# Patient Record
Sex: Female | Born: 2005 | Race: Black or African American | Hispanic: No | Marital: Single | Smoking: Never smoker
Health system: Southern US, Community
[De-identification: ages and names within clinical notes are randomized; demographics above are authoritative.]

## PROBLEM LIST (undated history)

## (undated) DIAGNOSIS — K5900 Constipation, unspecified: Secondary | ICD-10-CM

## (undated) DIAGNOSIS — J45909 Unspecified asthma, uncomplicated: Secondary | ICD-10-CM

## (undated) DIAGNOSIS — S060XAA Concussion with loss of consciousness status unknown, initial encounter: Secondary | ICD-10-CM

## (undated) DIAGNOSIS — S060X9A Concussion with loss of consciousness of unspecified duration, initial encounter: Secondary | ICD-10-CM

## (undated) DIAGNOSIS — G4700 Insomnia, unspecified: Secondary | ICD-10-CM

## (undated) HISTORY — DX: Insomnia, unspecified: G47.00

---
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
## 2023-01-03 DIAGNOSIS — F411 Generalized anxiety disorder: Secondary | ICD-10-CM | POA: Diagnosis not present

## 2023-01-04 ENCOUNTER — Other Ambulatory Visit: Payer: Medicaid Other

## 2023-01-06 ENCOUNTER — Other Ambulatory Visit: Payer: Medicaid Other

## 2023-01-06 DIAGNOSIS — Z111 Encounter for screening for respiratory tuberculosis: Secondary | ICD-10-CM | POA: Diagnosis not present

## 2023-01-09 ENCOUNTER — Ambulatory Visit: Payer: Medicaid Other | Admitting: Pediatrics

## 2023-01-09 VITALS — Wt 176.5 lb

## 2023-01-09 DIAGNOSIS — A159 Respiratory tuberculosis unspecified: Secondary | ICD-10-CM

## 2023-01-09 LAB — READ PPD: TB Skin Test: NEGATIVE

## 2023-01-09 NOTE — Patient Instructions (Addendum)
PPD performed on 01/06/2023 was read on 01/09/2023 by Clois Comber (CMA) documented as negative 0mm no induration.

## 2023-01-11 ENCOUNTER — Other Ambulatory Visit: Payer: Medicaid Other

## 2023-01-11 NOTE — Progress Notes (Signed)
TB screen negative.

## 2023-01-17 DIAGNOSIS — F411 Generalized anxiety disorder: Secondary | ICD-10-CM | POA: Diagnosis not present

## 2023-03-07 ENCOUNTER — Other Ambulatory Visit: Payer: Self-pay | Admitting: Pediatrics

## 2023-03-07 DIAGNOSIS — J4520 Mild intermittent asthma, uncomplicated: Secondary | ICD-10-CM

## 2023-04-17 ENCOUNTER — Other Ambulatory Visit: Payer: Self-pay | Admitting: Pediatrics

## 2023-05-01 ENCOUNTER — Encounter: Payer: Self-pay | Admitting: Pediatrics

## 2023-05-02 ENCOUNTER — Telehealth: Payer: Self-pay | Admitting: Pediatrics

## 2023-05-02 NOTE — Telephone Encounter (Signed)
Called patient and left message on both numbers on file to give [place] a call back regarding check up appt.

## 2023-05-02 NOTE — Telephone Encounter (Signed)
Called patient for the 2nd time today and left message to return call regarding appt for checkup.

## 2023-05-09 ENCOUNTER — Ambulatory Visit: Payer: Self-pay | Admitting: Pediatrics

## 2023-06-15 DIAGNOSIS — H5213 Myopia, bilateral: Secondary | ICD-10-CM | POA: Diagnosis not present

## 2023-10-05 ENCOUNTER — Other Ambulatory Visit: Payer: Self-pay | Admitting: Pediatrics

## 2023-11-01 ENCOUNTER — Other Ambulatory Visit: Payer: Self-pay | Admitting: Pediatrics

## 2023-12-06 DIAGNOSIS — F411 Generalized anxiety disorder: Secondary | ICD-10-CM | POA: Diagnosis not present

## 2023-12-25 DIAGNOSIS — F411 Generalized anxiety disorder: Secondary | ICD-10-CM | POA: Diagnosis not present

## 2024-01-08 ENCOUNTER — Other Ambulatory Visit (HOSPITAL_COMMUNITY)
Admission: RE | Admit: 2024-01-08 | Discharge: 2024-01-08 | Disposition: A | Discharge status: Home / Self Care | Source: Ambulatory Visit | Attending: Pediatrics | Admitting: Pediatrics

## 2024-01-08 ENCOUNTER — Encounter: Payer: Self-pay | Admitting: Pediatrics

## 2024-01-08 ENCOUNTER — Ambulatory Visit (INDEPENDENT_AMBULATORY_CARE_PROVIDER_SITE_OTHER): Admitting: Pediatrics

## 2024-01-08 VITALS — BP 108/68 | HR 71 | Ht 65.95 in | Wt 175.6 lb

## 2024-01-08 DIAGNOSIS — Z114 Encounter for screening for human immunodeficiency virus [HIV]: Secondary | ICD-10-CM | POA: Diagnosis not present

## 2024-01-08 DIAGNOSIS — N946 Dysmenorrhea, unspecified: Secondary | ICD-10-CM

## 2024-01-08 DIAGNOSIS — Z113 Encounter for screening for infections with a predominantly sexual mode of transmission: Secondary | ICD-10-CM

## 2024-01-08 DIAGNOSIS — Z1339 Encounter for screening examination for other mental health and behavioral disorders: Secondary | ICD-10-CM

## 2024-01-08 DIAGNOSIS — J302 Other seasonal allergic rhinitis: Secondary | ICD-10-CM | POA: Diagnosis not present

## 2024-01-08 DIAGNOSIS — Z0001 Encounter for general adult medical examination with abnormal findings: Secondary | ICD-10-CM

## 2024-01-08 DIAGNOSIS — Z1331 Encounter for screening for depression: Secondary | ICD-10-CM

## 2024-01-08 DIAGNOSIS — J4540 Moderate persistent asthma, uncomplicated: Secondary | ICD-10-CM | POA: Diagnosis not present

## 2024-01-08 DIAGNOSIS — Z68.41 Body mass index (BMI) pediatric, 85th percentile to less than 95th percentile for age: Secondary | ICD-10-CM | POA: Diagnosis not present

## 2024-01-08 DIAGNOSIS — J4520 Mild intermittent asthma, uncomplicated: Secondary | ICD-10-CM

## 2024-01-08 LAB — POCT RAPID HIV: Rapid HIV, POC: NEGATIVE

## 2024-01-08 MED ORDER — SYMBICORT 160-4.5 MCG/ACT IN AERO: 2.0000 | INHALATION_SPRAY | Freq: Two times a day (BID) | RESPIRATORY_TRACT | 3 refills | Status: AC

## 2024-01-08 MED ORDER — ALBUTEROL SULFATE HFA 108 (90 BASE) MCG/ACT IN AERS
2.0000 | INHALATION_SPRAY | RESPIRATORY_TRACT | 2 refills | Status: AC | PRN
Start: 2024-01-08 — End: ?

## 2024-01-08 MED ORDER — OLOPATADINE HCL 0.2 % OP SOLN: 1.0000 [drp] | Freq: Every day | OPHTHALMIC | 6 refills | Status: AC

## 2024-01-08 MED ORDER — NAPROXEN 250 MG PO TABS: 250.0000 mg | ORAL_TABLET | Freq: Two times a day (BID) | ORAL | 3 refills | Status: AC

## 2024-01-08 MED ORDER — FLUTICASONE PROPIONATE 50 MCG/ACT NA SUSP: 2.0000 | Freq: Every day | NASAL | 6 refills | Status: AC

## 2024-01-08 MED ORDER — MONTELUKAST SODIUM 10 MG PO TABS: 10.0000 mg | ORAL_TABLET | Freq: Every day | ORAL | 4 refills | Status: AC

## 2024-01-08 MED ORDER — CETIRIZINE HCL 10 MG PO TABS: 10.0000 mg | ORAL_TABLET | Freq: Every day | ORAL | 4 refills | Status: AC

## 2024-01-08 NOTE — Patient Instructions (Signed)
Goals: Choose more whole grains, lean protein, low-fat dairy, and fruits/non-starchy vegetables. Aim for 60 min of moderate physical activity daily. Limit sugar-sweetened beverages and concentrated sweets. Limit screen time to less than 2 hours daily.  [address]10 5 servings of fruits/vegetables a day 3 meals a day, no meal skipping 2 hours of screen time or less 1 hour of vigorous physical activity Almost no sugar-sweetened beverages or foods    

## 2024-01-08 NOTE — Progress Notes (Signed)
 Adolescent Well Care Visit Kathleen Webster is a 18 y.o. female who is here for well care.    PCP:  Gabriella Arthor GAILS, MD   History was provided by the patient.   Current Issues: Current concerns include Here for school summer break. Overall doing well. Needs refill on asthma & allergy meds. Flare up of allergies this summer & using Symbicort    Nutrition: Nutrition/Eating Behaviors: eats a variety of foods & has been mindful about healthy eating. Adequate calcium in diet?: no- does not like milk or yogurt Supplements/ Vitamins: no  Exercise/ Media: Play any Sports?/ Exercise: walks Screen Time:  > 2 hours-counseling provided Media Rules or Monitoring?: no  Sleep:  Sleep: no issues  Social Screening: Lives - college apartment Parental relations:  good Activities, Work, and Regulatory affairs officer?: works at [employer] out. RA at school. Concerns regarding behavior with peers?  no Stressors of note: no  Education: School Name: [employer]  School Grade: sophomore-major in business administration & music School performance: doing well; no concerns School Behavior: doing well; no concerns  Menstruation:    Menstrual History: regular cycles, every month, has painful cycles & takes naproxen   Confidential Social History: Tobacco?  yes Secondhand smoke exposure?  no Drugs/ETOH?  no  Sexually Active?  no   Pregnancy Prevention: Abstinence  Safe at home, in school & in relationships?  Yes Safe to self?  Yes   Screenings: Patient has a dental home: yes  The patient completed the Rapid Assessment for Adolescent Preventive Services screening questionnaire and the following topics were identified as risk factors and discussed: healthy eating, exercise, marijuana use, drug use, condom use, birth control, mental health issues, and screen time   PHQ-9 completed and results indicated - negative  Physical Exam:  Vitals:   01/08/24 0844  BP: 108/68  Pulse: 71  SpO2: 98%  Weight: 175 lb 9.6 oz  (79.7 kg)  Height: 5' 5.95 (1.675 m)   BP 108/68 (BP Location: Left Arm, Patient Position: Sitting, Cuff Size: Normal)   Pulse 71   Ht 5' 5.95 (1.675 m)   Wt 175 lb 9.6 oz (79.7 kg)   SpO2 98%   BMI 28.39 kg/m  Body mass index: body mass index is 28.39 kg/m. Blood pressure %iles are not available for patients who are 18 years or older.  Hearing Screening  Method: Audiometry   500Hz  1000Hz  2000Hz  4000Hz   Right ear Fail 20 20 20   Left ear Fail 20 20 20    Vision Screening   Right eye Left eye Both eyes  Without correction     With correction 20/20 20/20 20/20     General Appearance:   alert, oriented, no acute distress  HENT: Normocephalic, no obvious abnormality, conjunctiva clear  Mouth:   Normal appearing teeth, no obvious discoloration, dental caries, or dental caps  Neck:   Supple; thyroid : no enlargement, symmetric, no tenderness/mass/nodules  Chest normal  Lungs:   Clear to auscultation bilaterally, normal work of breathing  Heart:   Regular rate and rhythm, S1 and S2 normal, no murmurs;   Abdomen:   Soft, non-tender, no mass, or organomegaly  GU normal female external genitalia, pelvic not performed  Musculoskeletal:   Tone and strength strong and symmetrical, all extremities               Lymphatic:   No cervical adenopathy  Skin/Hair/Nails:   Skin warm, dry and intact, no rashes, no bruises or petechiae  Neurologic:   Strength, gait, and coordination normal  and age-appropriate     Assessment and Plan:   18 yr old F for wlel visit Mod persistent asthma with seasonal allergies Continue Symbicort  use daily during flare ups & then can use during exacerbations. Refilled all asthma & allergy meds.  BMI is not appropriate for age Counseled regarding 5-2-1-0 goals of healthy active living including:  - eating at least 5 fruits and vegetables a day - at least 1 hour of activity - no sugary beverages - eating three meals each day with age-appropriate servings -  age-appropriate screen time - age-appropriate sleep patterns    Hearing screening result:normal Vision screening result: normal  Orders Placed This Encounter  Procedures   POCT Rapid HIV     Return in 1 year (on 01/07/2025) for Well child with Dr Gabriella.SABRA Arthor LULLA Gabriella, MD

## 2024-01-09 ENCOUNTER — Other Ambulatory Visit: Payer: Self-pay | Admitting: Pediatrics

## 2024-01-09 DIAGNOSIS — J4540 Moderate persistent asthma, uncomplicated: Secondary | ICD-10-CM

## 2024-01-09 LAB — URINE CYTOLOGY ANCILLARY ONLY
Chlamydia: NEGATIVE
Comment: NEGATIVE
Comment: NORMAL
Neisseria Gonorrhea: NEGATIVE

## 2024-01-10 DIAGNOSIS — F411 Generalized anxiety disorder: Secondary | ICD-10-CM | POA: Diagnosis not present

## 2024-04-02 ENCOUNTER — Telehealth: Payer: Self-pay | Admitting: Pediatrics
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
## 2024-04-04 ENCOUNTER — Encounter: Payer: Self-pay | Admitting: Pediatrics

## 2024-04-05 DIAGNOSIS — R55 Syncope and collapse: Secondary | ICD-10-CM | POA: Diagnosis not present

## 2024-04-05 DIAGNOSIS — Y92410 Unspecified street and highway as the place of occurrence of the external cause: Secondary | ICD-10-CM | POA: Diagnosis not present

## 2024-04-05 DIAGNOSIS — M25551 Pain in right hip: Secondary | ICD-10-CM | POA: Diagnosis not present

## 2024-04-26 DIAGNOSIS — M25571 Pain in right ankle and joints of right foot: Secondary | ICD-10-CM | POA: Diagnosis not present

## 2024-04-26 DIAGNOSIS — M79661 Pain in right lower leg: Secondary | ICD-10-CM | POA: Diagnosis not present

## 2024-04-26 DIAGNOSIS — M79651 Pain in right thigh: Secondary | ICD-10-CM | POA: Diagnosis not present

## 2024-04-26 DIAGNOSIS — M25551 Pain in right hip: Secondary | ICD-10-CM | POA: Diagnosis not present

## 2024-04-29 ENCOUNTER — Encounter: Payer: Self-pay | Admitting: Pediatrics

## 2024-04-29 ENCOUNTER — Telehealth: Payer: Self-pay | Admitting: Pediatrics

## 2024-04-29 ENCOUNTER — Ambulatory Visit: Admitting: Pediatrics

## 2024-04-29 VITALS — Wt 180.6 lb

## 2024-04-29 DIAGNOSIS — M25551 Pain in right hip: Secondary | ICD-10-CM | POA: Diagnosis not present
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
## 2024-04-29 NOTE — Patient Instructions (Signed)
 Please call [facility] to schedule Orthopedics & let us  know about that appt.

## 2024-04-29 NOTE — Telephone Encounter (Signed)
 Good morning,  Please give mom a call once the FMLA forms has been completed and ready for pick up.  Thanks,

## 2024-05-01 NOTE — Telephone Encounter (Signed)
 FMLA forms placed in Dr. Durenda folder.

## 2024-05-02 NOTE — Telephone Encounter (Signed)
 Parent notified FMLA form is ready for pick up.Copy to media to scan.

## 2024-05-04 DIAGNOSIS — M25551 Pain in right hip: Secondary | ICD-10-CM | POA: Insufficient documentation

## 2024-05-08 DIAGNOSIS — M25571 Pain in right ankle and joints of right foot: Secondary | ICD-10-CM | POA: Diagnosis not present

## 2024-05-29 DIAGNOSIS — S93421A Sprain of deltoid ligament of right ankle, initial encounter: Secondary | ICD-10-CM | POA: Diagnosis not present

## 2024-05-30 DIAGNOSIS — G90521 Complex regional pain syndrome I of right lower limb: Secondary | ICD-10-CM | POA: Diagnosis not present

## 2024-05-30 DIAGNOSIS — R208 Other disturbances of skin sensation: Secondary | ICD-10-CM | POA: Diagnosis not present

## 2024-06-05 DIAGNOSIS — R051 Acute cough: Secondary | ICD-10-CM | POA: Diagnosis not present

## 2024-06-05 DIAGNOSIS — J101 Influenza due to other identified influenza virus with other respiratory manifestations: Secondary | ICD-10-CM | POA: Diagnosis not present

## 2024-06-10 DIAGNOSIS — S93401A Sprain of unspecified ligament of right ankle, initial encounter: Secondary | ICD-10-CM | POA: Diagnosis not present

## 2024-06-10 DIAGNOSIS — M25571 Pain in right ankle and joints of right foot: Secondary | ICD-10-CM | POA: Diagnosis not present

## 2024-06-10 DIAGNOSIS — M25871 Other specified joint disorders, right ankle and foot: Secondary | ICD-10-CM | POA: Diagnosis not present

## 2024-06-10 DIAGNOSIS — S96911A Strain of unspecified muscle and tendon at ankle and foot level, right foot, initial encounter: Secondary | ICD-10-CM | POA: Diagnosis not present

## 2024-06-10 DIAGNOSIS — M25471 Effusion, right ankle: Secondary | ICD-10-CM | POA: Diagnosis not present

## 2024-07-15 ENCOUNTER — Encounter: Payer: Self-pay | Admitting: Pediatrics

## 2024-07-29 ENCOUNTER — Ambulatory Visit: Admitting: Pediatrics
